# Patient Record
Sex: Female | Born: 1965 | Race: White | Hispanic: No | Marital: Married
Health system: Southern US, Community
[De-identification: ages and names within clinical notes are randomized; demographics above are authoritative.]

---

## 2005-11-25 ENCOUNTER — Other Ambulatory Visit: Admission: RE | Admit: 2005-11-25 | Discharge: 2005-11-25 | Payer: Self-pay | Admitting: Obstetrics and Gynecology

## 2006-08-19 ENCOUNTER — Encounter: Admission: RE | Admit: 2006-08-19 | Discharge: 2006-08-19 | Payer: Self-pay | Admitting: Obstetrics and Gynecology

## 2006-08-27 ENCOUNTER — Encounter: Admission: RE | Admit: 2006-08-27 | Discharge: 2006-08-27 | Payer: Self-pay | Admitting: Obstetrics and Gynecology

## 2007-08-31 ENCOUNTER — Encounter: Admission: RE | Admit: 2007-08-31 | Discharge: 2007-08-31 | Payer: Self-pay | Admitting: Obstetrics and Gynecology

## 2007-10-22 IMAGING — MG MM SCREEN MAMMOGRAM BILATERAL
2 series · 2 of 2 positions shown · non-contrast
Comparison: none

DG SCREEN MAMMOGRAM BILATERAL
Bilateral CC and MLO view(s) were taken.

DIGITAL SCREENING MAMMOGRAM WITH CAD:
There is a dense fibroglandular pattern.  Microcalcifications are present in the left breast.  
Characterization with magnification views is recommended.  No mass or malignant type calcifications
are identified in the right breast.  Compared with prior studies.

[R CC]
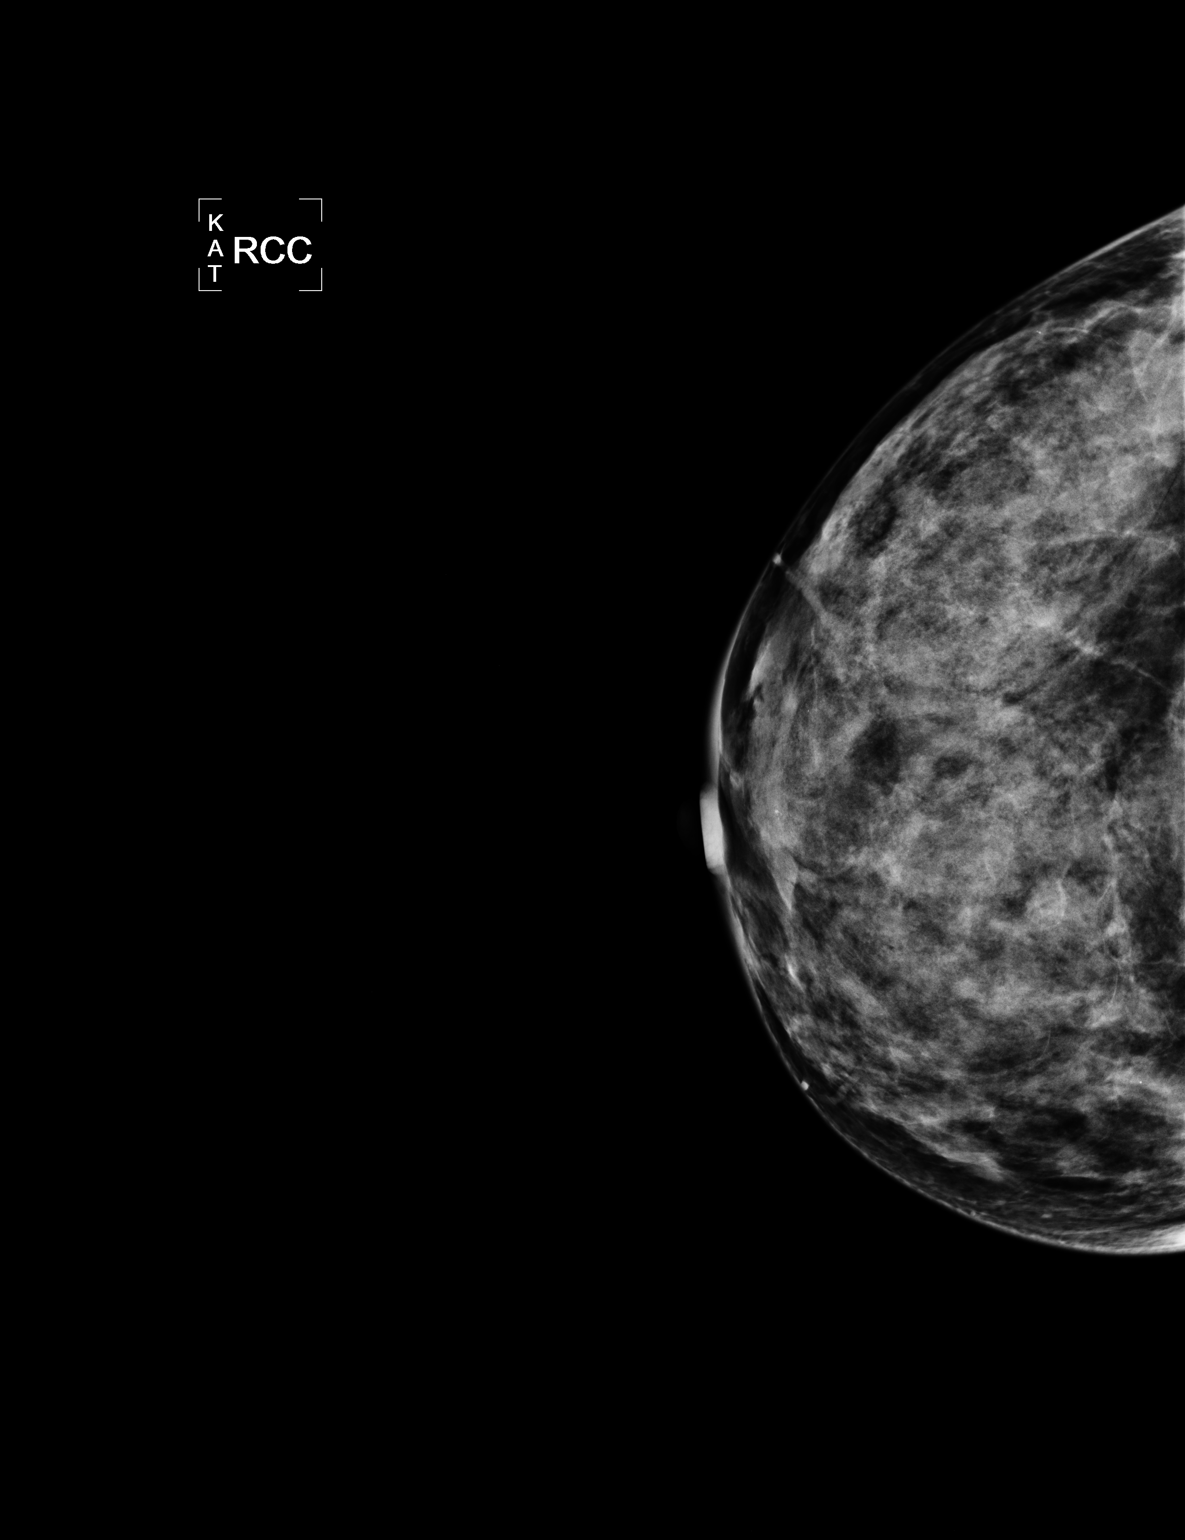

[L CC]
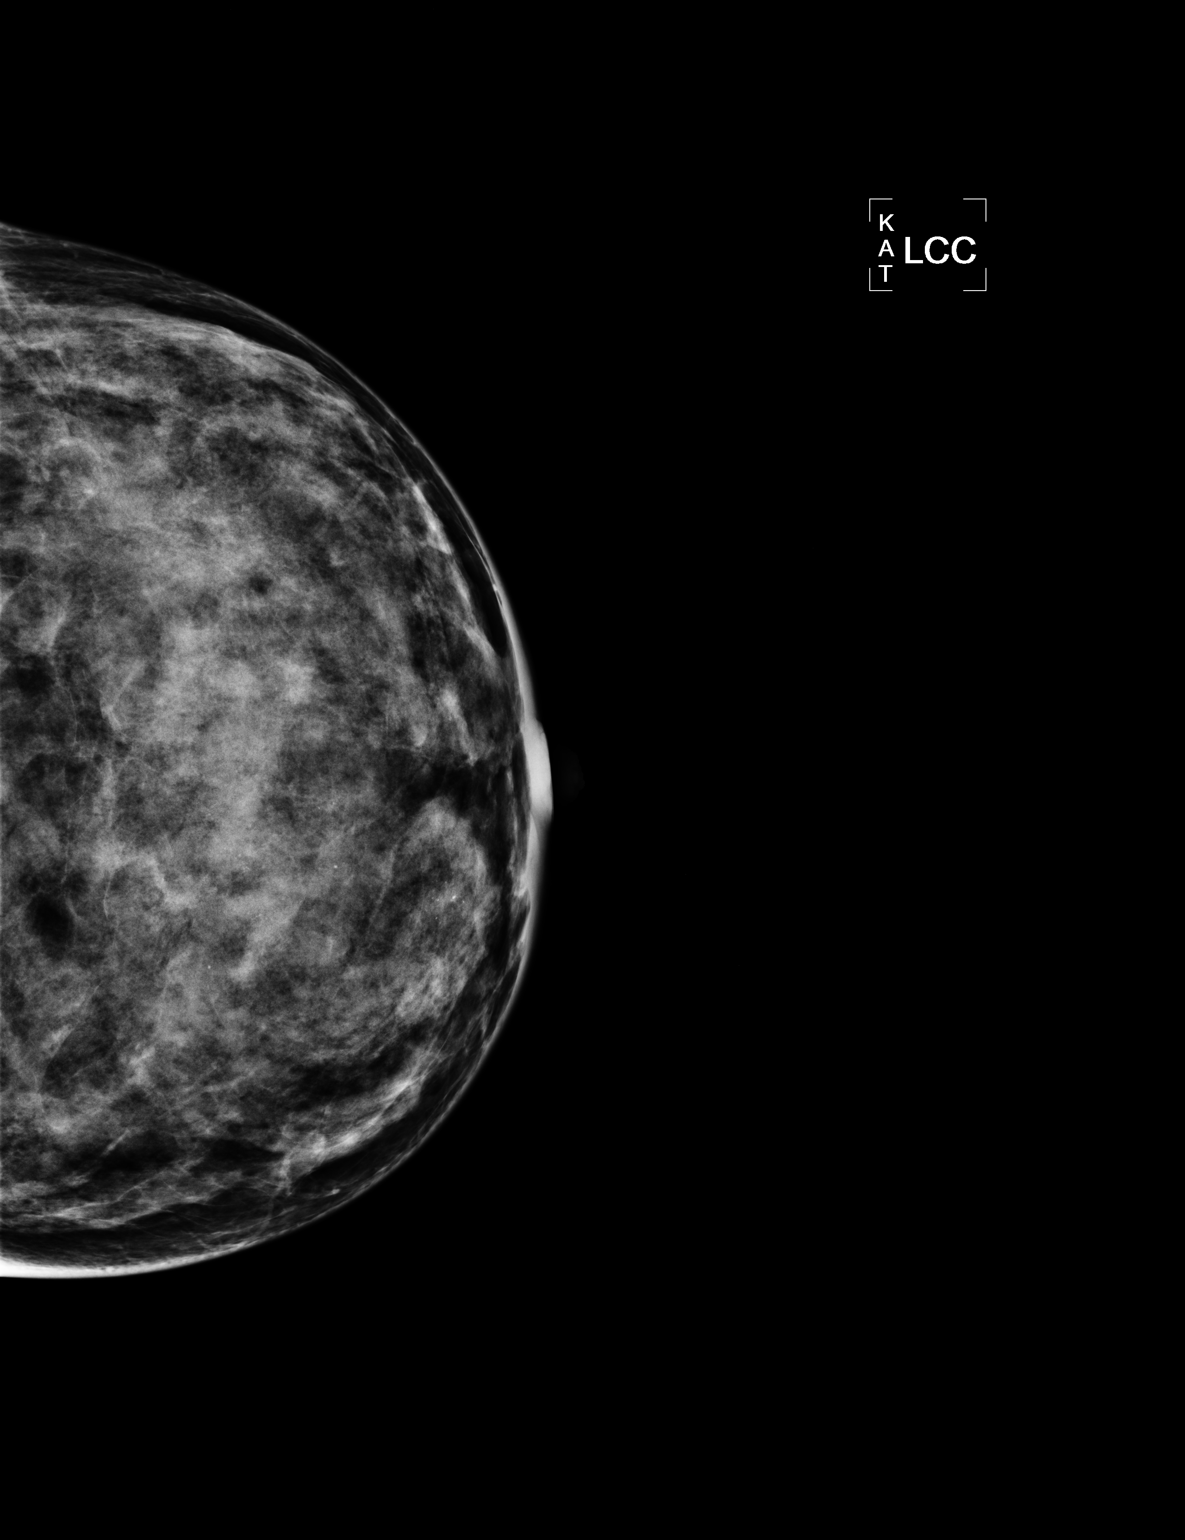

[2 of 2 positions shown; findings below may reference images not displayed]

IMPRESSION: Calcifications, left breast.  Additional evaluation is indicated. The patient will be contacted for
additional studies and a supplementary report will follow.  No specific mammographic evidence of 
malignancy, right breast.

ASSESSMENT: Need additional imaging evaluation and/or prior mammograms for comparison - BI-RADS 0

Further imaging of the left breast.
ANALYZED BY COMPUTER AIDED DETECTION. , THIS PROCEDURE WAS A DIGITAL MAMMOGRAM.

## 2008-10-02 ENCOUNTER — Encounter: Admission: RE | Admit: 2008-10-02 | Discharge: 2008-10-02 | Payer: Self-pay | Admitting: Obstetrics and Gynecology

## 2009-11-13 ENCOUNTER — Encounter: Admission: RE | Admit: 2009-11-13 | Discharge: 2009-11-13 | Payer: Self-pay | Admitting: Obstetrics and Gynecology

## 2010-12-21 ENCOUNTER — Encounter: Payer: Self-pay | Admitting: Obstetrics and Gynecology

## 2010-12-23 ENCOUNTER — Encounter
Admission: RE | Admit: 2010-12-23 | Discharge: 2010-12-23 | Payer: Self-pay | Source: Home / Self Care | Attending: Obstetrics and Gynecology | Admitting: Obstetrics and Gynecology

## 2011-12-28 ENCOUNTER — Other Ambulatory Visit: Payer: Self-pay | Admitting: Obstetrics and Gynecology

## 2011-12-28 DIAGNOSIS — Z1231 Encounter for screening mammogram for malignant neoplasm of breast: Secondary | ICD-10-CM

## 2012-01-20 ENCOUNTER — Ambulatory Visit
Admission: RE | Admit: 2012-01-20 | Discharge: 2012-01-20 | Disposition: A | Discharge status: Home / Self Care | Payer: BC Managed Care – PPO | Source: Ambulatory Visit | Attending: Obstetrics and Gynecology | Admitting: Obstetrics and Gynecology

## 2012-01-20 DIAGNOSIS — Z1231 Encounter for screening mammogram for malignant neoplasm of breast: Secondary | ICD-10-CM

## 2013-03-08 ENCOUNTER — Other Ambulatory Visit: Payer: Self-pay

## 2013-03-08 DIAGNOSIS — Z1231 Encounter for screening mammogram for malignant neoplasm of breast: Secondary | ICD-10-CM

## 2013-04-12 ENCOUNTER — Ambulatory Visit
Admission: RE | Admit: 2013-04-12 | Discharge: 2013-04-12 | Disposition: A | Discharge status: Home / Self Care | Payer: BC Managed Care – PPO | Source: Ambulatory Visit

## 2013-04-12 DIAGNOSIS — Z1231 Encounter for screening mammogram for malignant neoplasm of breast: Secondary | ICD-10-CM

## 2014-05-21 ENCOUNTER — Other Ambulatory Visit: Payer: Self-pay | Admitting: Obstetrics and Gynecology

## 2014-05-21 DIAGNOSIS — R2231 Localized swelling, mass and lump, right upper limb: Secondary | ICD-10-CM

## 2014-05-28 ENCOUNTER — Ambulatory Visit
Admission: RE | Admit: 2014-05-28 | Discharge: 2014-05-28 | Disposition: A | Discharge status: Home / Self Care | Payer: BC Managed Care – PPO | Source: Ambulatory Visit | Attending: Obstetrics and Gynecology | Admitting: Obstetrics and Gynecology

## 2014-05-28 ENCOUNTER — Encounter (INDEPENDENT_AMBULATORY_CARE_PROVIDER_SITE_OTHER): Payer: Self-pay

## 2014-05-28 ENCOUNTER — Other Ambulatory Visit: Payer: Self-pay | Admitting: Obstetrics and Gynecology

## 2014-05-28 DIAGNOSIS — R2231 Localized swelling, mass and lump, right upper limb: Secondary | ICD-10-CM

## 2019-01-05 ENCOUNTER — Other Ambulatory Visit: Payer: Self-pay | Admitting: Obstetrics and Gynecology

## 2019-01-05 DIAGNOSIS — R928 Other abnormal and inconclusive findings on diagnostic imaging of breast: Secondary | ICD-10-CM

## 2019-01-06 ENCOUNTER — Other Ambulatory Visit: Payer: Self-pay | Admitting: Obstetrics and Gynecology

## 2019-01-06 DIAGNOSIS — R928 Other abnormal and inconclusive findings on diagnostic imaging of breast: Secondary | ICD-10-CM

## 2019-01-10 ENCOUNTER — Ambulatory Visit
Admission: RE | Admit: 2019-01-10 | Discharge: 2019-01-10 | Disposition: A | Discharge status: Home / Self Care | Payer: BLUE CROSS/BLUE SHIELD | Source: Ambulatory Visit | Attending: Obstetrics and Gynecology | Admitting: Obstetrics and Gynecology

## 2019-01-10 DIAGNOSIS — R928 Other abnormal and inconclusive findings on diagnostic imaging of breast: Secondary | ICD-10-CM

## 2020-03-14 IMAGING — MG DIGITAL DIAGNOSTIC UNILATERAL RIGHT MAMMOGRAM WITH TOMO AND CAD
4 series · 4 of 12 positions shown · non-contrast
Comparison: Previous exam(s).

CLINICAL DATA: Patient recalled screening right breast asymmetry.

EXAM:
DIGITAL DIAGNOSTIC RIGHT MAMMOGRAM WITH CAD AND TOMO
ULTRASOUND RIGHT BREAST

[R MLO synth-2D]
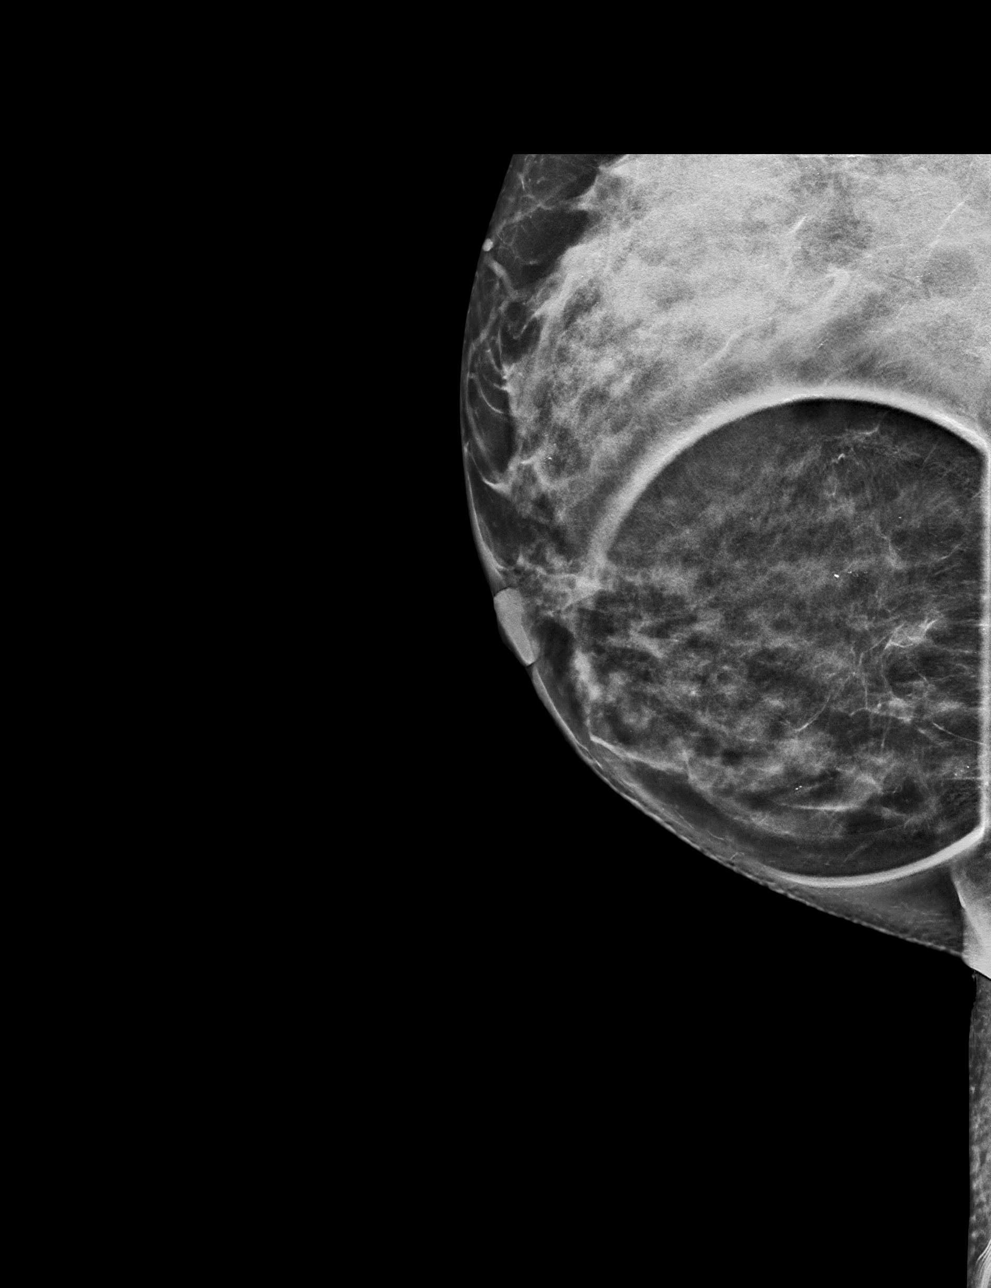

[R ML synth-2D]
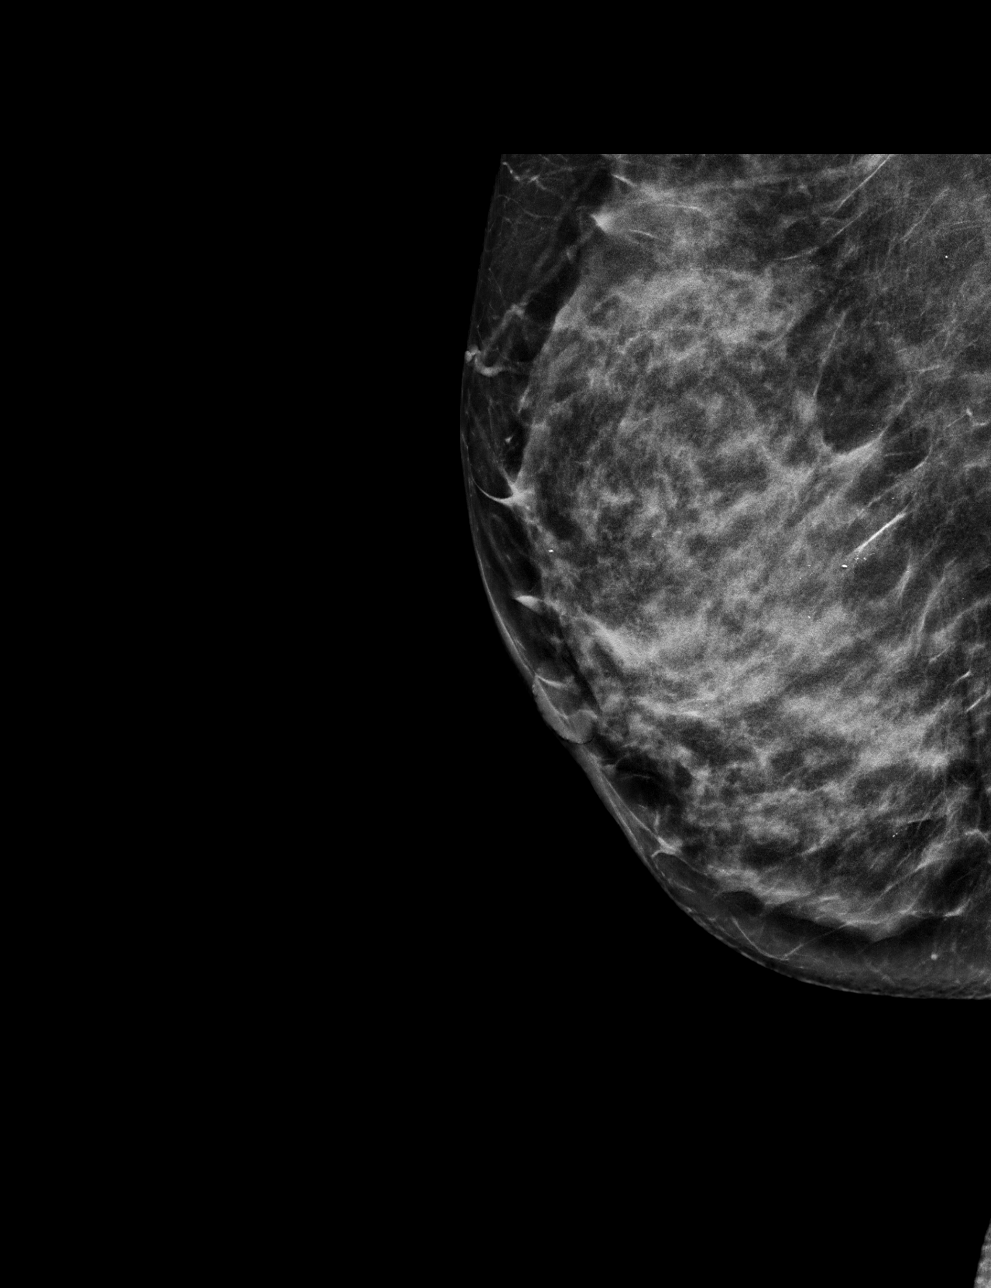

[R MLO tomo · tomo slice 31/62.0]
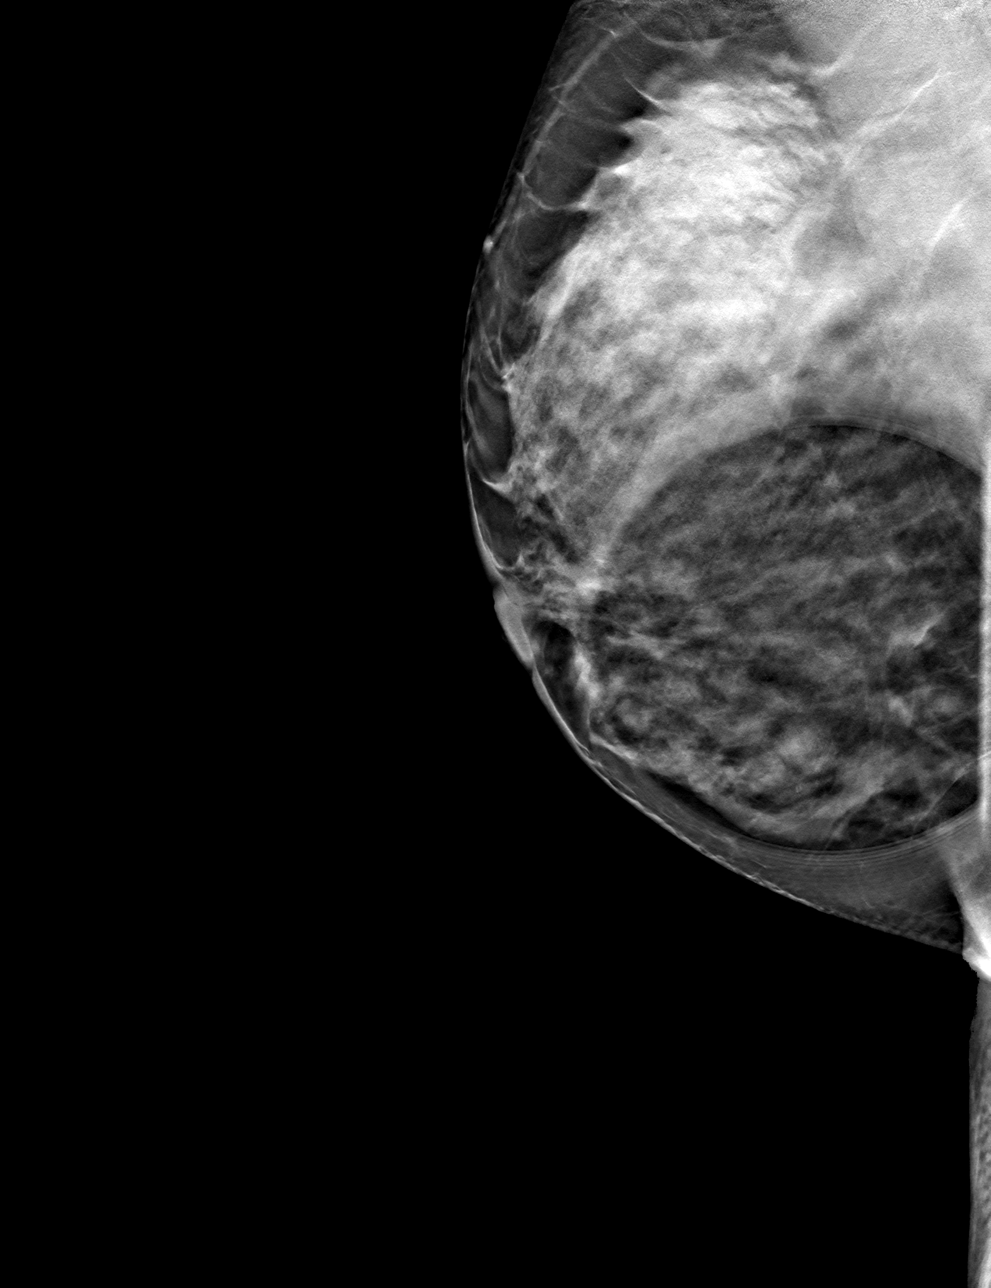

[R ML tomo · tomo slice 31/61.0]
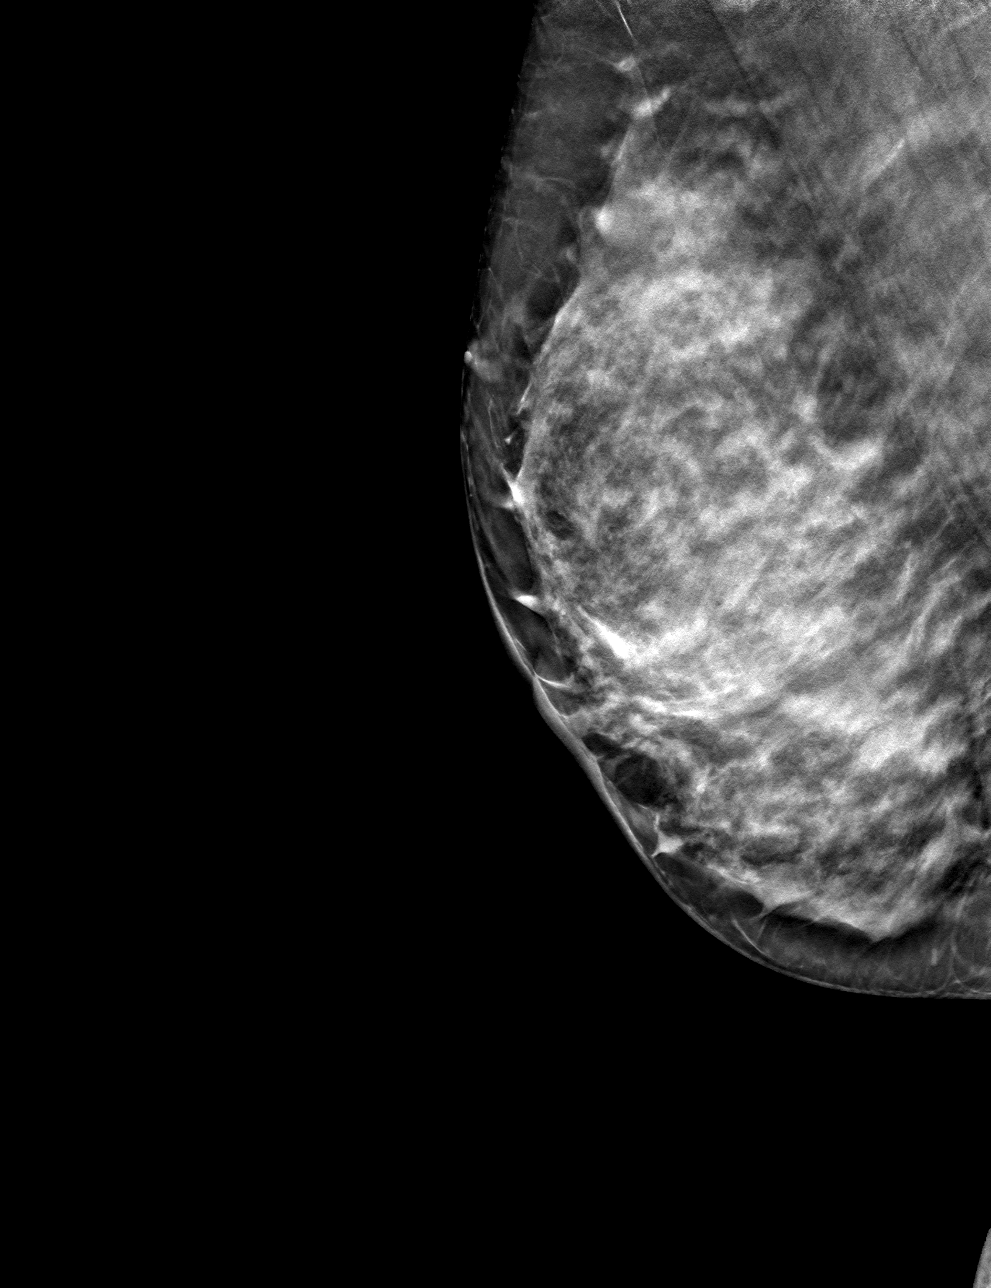

[4 of 12 positions shown; findings below may reference images not displayed]

ACR Breast Density Category c: The breast tissue is heterogeneously
dense, which may obscure small masses.
FINDINGS: Questioned asymmetry within the inferior aspect of the right breast
resolved additional imaging spot MLO tomosynthesis images true
lateral tomosynthesis images.

Mammographic images were processed with CAD.

Targeted ultrasound is performed, showing dense tissue within the
lower inner and lower breast. No suspicious mass identified.
IMPRESSION: No mammographic evidence for malignancy.

RECOMMENDATION:
Screening mammogram in one year.(Code:[id number]-[id number])

I have discussed the findings and recommendations with the patient.
Results were also provided in writing at the conclusion of the
visit. If applicable, a reminder letter will be sent to the patient
regarding the next appointment.

BI-RADS CATEGORY  1: Negative.
